# Patient Record
Sex: Female | Born: 1945
Health system: Southern US, Community
[De-identification: ages and names within clinical notes are randomized; demographics above are authoritative.]

---

## 1998-02-13 ENCOUNTER — Ambulatory Visit (HOSPITAL_BASED_OUTPATIENT_CLINIC_OR_DEPARTMENT_OTHER): Admission: RE | Admit: 1998-02-13 | Discharge: 1998-02-13 | Payer: Self-pay | Admitting: Orthopedic Surgery

## 2004-07-29 ENCOUNTER — Ambulatory Visit: Payer: Self-pay | Admitting: Internal Medicine

## 2005-07-28 ENCOUNTER — Ambulatory Visit: Payer: Self-pay | Admitting: Internal Medicine

## 2005-07-30 ENCOUNTER — Ambulatory Visit: Payer: Self-pay | Admitting: Internal Medicine

## 2005-08-14 ENCOUNTER — Ambulatory Visit: Payer: Self-pay | Admitting: Internal Medicine

## 2005-12-31 ENCOUNTER — Inpatient Hospital Stay (HOSPITAL_COMMUNITY): Admission: RE | Admit: 2005-12-31 | Discharge: 2006-01-02 | Payer: Self-pay | Admitting: Neurosurgery

## 2006-08-06 ENCOUNTER — Ambulatory Visit: Payer: Self-pay | Admitting: Internal Medicine

## 2007-08-10 ENCOUNTER — Ambulatory Visit: Payer: Self-pay | Admitting: Internal Medicine

## 2008-04-11 ENCOUNTER — Ambulatory Visit: Payer: Self-pay | Admitting: General Surgery

## 2008-04-11 ENCOUNTER — Other Ambulatory Visit: Payer: Self-pay

## 2008-04-14 ENCOUNTER — Ambulatory Visit: Payer: Self-pay | Admitting: General Surgery

## 2008-08-10 ENCOUNTER — Ambulatory Visit: Payer: Self-pay | Admitting: Internal Medicine

## 2009-08-15 ENCOUNTER — Ambulatory Visit: Payer: Self-pay | Admitting: Internal Medicine

## 2010-05-10 ENCOUNTER — Ambulatory Visit: Payer: Self-pay | Admitting: Internal Medicine

## 2010-06-10 ENCOUNTER — Ambulatory Visit: Payer: Self-pay | Admitting: Sports Medicine

## 2010-06-21 ENCOUNTER — Ambulatory Visit: Payer: Self-pay | Admitting: Unknown Physician Specialty

## 2010-07-09 ENCOUNTER — Ambulatory Visit: Payer: Self-pay | Admitting: Unknown Physician Specialty

## 2010-07-12 LAB — PATHOLOGY REPORT

## 2010-09-02 ENCOUNTER — Ambulatory Visit: Payer: Self-pay | Admitting: Internal Medicine

## 2011-11-26 ENCOUNTER — Ambulatory Visit: Payer: Self-pay | Admitting: Family Medicine

## 2012-01-05 ENCOUNTER — Ambulatory Visit: Payer: Self-pay | Admitting: Gastroenterology

## 2012-02-09 ENCOUNTER — Ambulatory Visit: Payer: Self-pay | Admitting: Unknown Physician Specialty

## 2012-03-18 ENCOUNTER — Ambulatory Visit: Payer: Self-pay | Admitting: Family Medicine

## 2013-06-15 ENCOUNTER — Ambulatory Visit: Payer: Self-pay | Admitting: Family Medicine

## 2014-02-20 ENCOUNTER — Emergency Department: Payer: Self-pay | Admitting: Emergency Medicine

## 2014-11-07 ENCOUNTER — Ambulatory Visit: Payer: Self-pay | Admitting: Surgery

## 2014-11-07 LAB — CBC
HCT: 38.3 % (ref 35.0–47.0)
HGB: 12.2 g/dL (ref 12.0–16.0)
MCH: 25.2 pg — AB (ref 26.0–34.0)
MCHC: 31.7 g/dL — AB (ref 32.0–36.0)
MCV: 79 fL — AB (ref 80–100)
PLATELETS: 207 10*3/uL (ref 150–440)
RBC: 4.82 10*6/uL (ref 3.80–5.20)
RDW: 14.6 % — AB (ref 11.5–14.5)
WBC: 6.2 10*3/uL (ref 3.6–11.0)

## 2014-11-07 LAB — URINALYSIS, COMPLETE
Bacteria: NONE SEEN
Bilirubin,UR: NEGATIVE
Blood: NEGATIVE
Glucose,UR: NEGATIVE mg/dL (ref 0–75)
Ketone: NEGATIVE
LEUKOCYTE ESTERASE: NEGATIVE
Nitrite: NEGATIVE
PH: 6 (ref 4.5–8.0)
PROTEIN: NEGATIVE
Specific Gravity: 1.014 (ref 1.003–1.030)
Squamous Epithelial: 1
WBC UR: NONE SEEN /HPF (ref 0–5)

## 2014-11-07 LAB — BASIC METABOLIC PANEL
ANION GAP: 6 — AB (ref 7–16)
BUN: 18 mg/dL
CO2: 26 mmol/L
CREATININE: 0.55 mg/dL
Calcium, Total: 9.1 mg/dL
Chloride: 106 mmol/L
EGFR (African American): >60
EGFR (Non-African Amer.): >60
GLUCOSE: 99 mg/dL
Potassium: 3.4 mmol/L — ABNORMAL LOW
Sodium: 138 mmol/L

## 2014-11-07 LAB — PROTIME-INR
INR: 1.1
PROTHROMBIN TIME: 14 s

## 2014-11-07 LAB — MRSA PCR SCREENING

## 2014-11-14 ENCOUNTER — Inpatient Hospital Stay
Admit: 2014-11-14 | Disposition: A | Discharge status: Home / Self Care | Payer: Self-pay | Attending: Surgery | Admitting: Surgery

## 2014-11-15 LAB — BASIC METABOLIC PANEL
ANION GAP: 8 (ref 7–16)
BUN: 16 mg/dL
CALCIUM: 8.4 mg/dL — AB
CO2: 27 mmol/L
Chloride: 103 mmol/L
Creatinine: 0.53 mg/dL
EGFR (African American): >60
EGFR (Non-African Amer.): >60
Glucose: 119 mg/dL — ABNORMAL HIGH
POTASSIUM: 3.2 mmol/L — AB
Sodium: 138 mmol/L

## 2014-11-15 LAB — HEMOGLOBIN: HGB: 11.8 g/dL — ABNORMAL LOW (ref 12.0–16.0)

## 2014-11-15 LAB — PLATELET COUNT: PLATELETS: 188 10*3/uL (ref 150–440)

## 2014-11-16 LAB — POTASSIUM: POTASSIUM: 4.2 mmol/L

## 2014-12-04 LAB — SURGICAL PATHOLOGY

## 2014-12-10 NOTE — Op Note (Signed)
PATIENT NAME:  Ann Ryan, Ann Ryan MR#:  [id number] DATE OF BIRTH:  [birth year]  DATE OF PROCEDURE:  11/14/2014  PREOPERATIVE DIAGNOSIS:  Cuff tear arthropathy right shoulder.   POSTOPERATIVE DIAGNOSIS:  Cuff tear arthropathy right shoulder.   PROCEDURE:  Reverse right total shoulder arthroplasty using an all-pressfit Biomet Comprehensive system with a 36 mm glenosphere, a 25 mm mini-base plate, a #04 mini-humeral stem, and a 44 mm humeral tray with a standard humeral insert.   SURGEON:  Maryagnes Amos, MD.   ASSISTANT:  Dedra Skeens, PA-C.   ANESTHESIA:  General endotracheal with an interscalene block placed preoperative by the anesthesiologist.   FINDINGS:  As noted above.   COMPLICATIONS:  None.   ESTIMATED BLOOD LOSS:  125 mL.   TOTAL FLUIDS:  900 mL of crystalloid.   URINE OUTPUT:  550 mL.    TOURNIQUET:  None.   DRAINS:  None.   CLOSURE:  Staples.   BRIEF CLINICAL NOTE:  The patient is a 69 year old female with a long history of progressively worsening right shoulder pain. Her symptoms have progressed despite medications, activity modification, etc. Her history and examination are consistent with massive cuff tear arthropathy as confirmed by plain radiographs and MRI scan. She presents at this time for a reverse right total shoulder arthroplasty.   PROCEDURE: The patient underwent placement of an interscalene block in the preoperative holding area. She was then brought into the operating room and lain in the supine position. After adequate general endotracheal intubation and anesthesia were obtained, a Foley catheter was placed. The patient was then repositioned in the beach chair position using the beach chair positioner. The right shoulder and upper extremity were prepped with ChloraPrep solution before being draped sterilely. Preoperative antibiotics were administered. A standard anterior approach to the shoulder was made through an approximately 4.5-5 inch incision beginning at  the coracoid process and extending inferiorly and laterally toward the insertion of the biceps tendon. The incision was carried down through the subcutaneous tissues to expose the deltopectoral fascia. The interval between the deltoid and pectoralis muscles was identified and this plane developed. The cephalic vein was identified and retracted laterally. This was later noted to have avulsed and was cauterized. Once the deltopectoral interval was developed, the bursal tissues were swept away to better visualize the lateral edge of the conjoined tendon. The self-retaining retractor was inserted beneath the conjoined tendon to better visualize the subscapularis tendon. This was released near its lesser tuberosity attachment, leaving approximately 1 cm of tendon to reattach later. Releases were made superiorly and inferiorly and several tagging sutures placed through the free margin of the subscapularis tendon before it too was retracted medially to provide better exposure to the anterior aspect of the shoulder joint. Of note, the three sisters were identified and coagulated prior to taking down the subscapularis tendon. The humeral head was brought into the wound and, using the external guide, the appropriate proximal humeral cut made.   Attention was directed to the glenoid. Exposure was improved by removing the labrum circumferentially. The center of the glenoid was identified and marked before the guidewire was drilled up through the glenoid into the glenoid neck. Care was taken to be sure that it was within bone both by palpation anteriorly and by using a depth gauge to palpate the bony channel. Once this was verified, the drill guide was advanced further. This was used to guide the glenoid reamer down. Glenoid was reamed appropriately before the permanent mini base plate was  impacted into place. A 35 mm central screw was inserted before the plate was further secured using four peripheral screws. The superior and  inferior screws were locking screws, whereas the anterior and posterior screws were nonlocking screws. Apparent excellent fixation of the glenoid baseplate was achieved. The 36 mm glenosphere was selected. The adapter was inserted and placed in the "D" offset position. After it was constructed on the back table, it was impacted into place. The Morse taper locking mechanism was verified using manual distraction and found to be excellent.   Attention was directed to the humeral side. The arm was externally rotated to position the humeral shaft in the wound optimally. The central canal was accessed through a tapered reamer. The canal was reamed sequentially beginning with the 5 mm reamer and progressing to the 10 mm tapered reamer. This provided excellent cortical chatter. The canal was broached sequentially beginning with a #7 broach, progressing to a #10 broach. This seated well. A trial reduction was performed using the standard humeral tray and insert. The arm demonstrated good tension, yet was able to be manipulated so that the hand could reach the opposite shoulder, the top of the head, and the back of the head. Therefore, the trial components were removed. The humeral shaft was irrigated thoroughly before the permanent #10 mini-humeral stem was impacted into place with care taken to maintain the appropriate retroversion. The 44 mm standard baseplate was and E-polyethylene humeral insert were put together on the back table before this construct was impacted into place. The Morse taper locking mechanism again was verified using manual distraction. The shoulder was relocated again and placed through a range of motion with the findings as described above.   The wound was copiously irrigated with bacitracin saline solution via the jet lavage system. In addition, a total of 20 mL of Exparel diluted out to 60 mL with normal saline was injected into the periarticular tissues and peri-incisional tissues to help with  postoperative analgesia. The subscapularis tendon was reapproximated through the remaining tendinous tissue attached to the lesser tuberosity using #2 FiberWire interrupted sutures. One of the sutures was actually brought through bone tunnels more proximally. The deltopectoral interval was reapproximated using 2-0 Vicryl interrupted sutures before the subcutaneous tissues were closed using 2-0 Vicryl interrupted sutures. The skin was closed using staples. Of note, 1 gram of tranexamic acid in 10 mL of normal saline was injected intra-articularly after closure of the deltopectoral fascia. A sterile occlusive dressing was applied to the shoulder before the arm was placed into a shoulder immobilizer. The patient was then awakened, extubated, and returned to the recovery room in satisfactory condition after tolerating the procedure well.   Of note, Dedra Skeensodd Mundy, my assistant, was present for the entire case. His presence was critical in order to optimize exposure and arm position during the numerous trials, humeral and glenoid preparation, and implant positioning.    ____________________________ J. Derald MacleodJeffrey Poggi, MD jjp:bu D: 11/14/2014 16:49:07 ET T: 11/14/2014 17:15:50 ET JOB#: [id number]  cc: Maryagnes AmosJ. Jeffrey Poggi, MD, <Dictator> Maryagnes AmosJ. JEFFREY POGGI MD ELECTRONICALLY SIGNED 11/16/2014 16:05

## 2015-03-12 ENCOUNTER — Other Ambulatory Visit: Payer: Self-pay | Admitting: Family Medicine

## 2015-03-12 DIAGNOSIS — Z1231 Encounter for screening mammogram for malignant neoplasm of breast: Secondary | ICD-10-CM

## 2015-03-21 ENCOUNTER — Ambulatory Visit
Admission: RE | Admit: 2015-03-21 | Discharge: 2015-03-21 | Disposition: A | Discharge status: Home / Self Care | Payer: Managed Care, Other (non HMO) | Source: Ambulatory Visit | Attending: Family Medicine | Admitting: Family Medicine

## 2015-03-21 DIAGNOSIS — Z1231 Encounter for screening mammogram for malignant neoplasm of breast: Secondary | ICD-10-CM | POA: Diagnosis present

## 2016-09-15 ENCOUNTER — Other Ambulatory Visit: Payer: Self-pay | Admitting: Family Medicine

## 2016-09-15 DIAGNOSIS — Z1231 Encounter for screening mammogram for malignant neoplasm of breast: Secondary | ICD-10-CM

## 2016-10-13 ENCOUNTER — Ambulatory Visit
Admission: RE | Admit: 2016-10-13 | Discharge: 2016-10-13 | Disposition: A | Discharge status: Home / Self Care | Payer: Medicare PPO | Source: Ambulatory Visit | Attending: Family Medicine | Admitting: Family Medicine

## 2016-10-13 DIAGNOSIS — Z1231 Encounter for screening mammogram for malignant neoplasm of breast: Secondary | ICD-10-CM | POA: Diagnosis not present

## 2017-08-31 DIAGNOSIS — M545 Low back pain: Secondary | ICD-10-CM | POA: Diagnosis not present

## 2017-12-08 ENCOUNTER — Other Ambulatory Visit: Payer: Self-pay | Admitting: Family Medicine

## 2017-12-08 DIAGNOSIS — Z1231 Encounter for screening mammogram for malignant neoplasm of breast: Secondary | ICD-10-CM

## 2017-12-25 ENCOUNTER — Ambulatory Visit
Admission: RE | Admit: 2017-12-25 | Discharge: 2017-12-25 | Disposition: A | Discharge status: Home / Self Care | Payer: Medicare HMO | Source: Ambulatory Visit | Attending: Family Medicine | Admitting: Family Medicine

## 2017-12-25 DIAGNOSIS — Z1231 Encounter for screening mammogram for malignant neoplasm of breast: Secondary | ICD-10-CM

## 2018-01-07 DIAGNOSIS — I10 Essential (primary) hypertension: Secondary | ICD-10-CM | POA: Diagnosis not present

## 2018-01-07 DIAGNOSIS — R7303 Prediabetes: Secondary | ICD-10-CM | POA: Diagnosis not present

## 2018-01-07 DIAGNOSIS — E7800 Pure hypercholesterolemia, unspecified: Secondary | ICD-10-CM | POA: Diagnosis not present

## 2018-01-14 DIAGNOSIS — E6609 Other obesity due to excess calories: Secondary | ICD-10-CM | POA: Diagnosis not present

## 2018-01-14 DIAGNOSIS — Z780 Asymptomatic menopausal state: Secondary | ICD-10-CM | POA: Diagnosis not present

## 2018-01-14 DIAGNOSIS — Z0000 Encounter for general adult medical examination without abnormal findings: Secondary | ICD-10-CM | POA: Diagnosis not present

## 2018-01-14 DIAGNOSIS — E7800 Pure hypercholesterolemia, unspecified: Secondary | ICD-10-CM | POA: Diagnosis not present

## 2018-01-14 DIAGNOSIS — R7303 Prediabetes: Secondary | ICD-10-CM | POA: Diagnosis not present

## 2018-01-14 DIAGNOSIS — I10 Essential (primary) hypertension: Secondary | ICD-10-CM | POA: Diagnosis not present

## 2018-01-20 DIAGNOSIS — Z780 Asymptomatic menopausal state: Secondary | ICD-10-CM | POA: Diagnosis not present

## 2018-01-26 DIAGNOSIS — E6609 Other obesity due to excess calories: Secondary | ICD-10-CM | POA: Diagnosis not present

## 2018-01-26 DIAGNOSIS — M8580 Other specified disorders of bone density and structure, unspecified site: Secondary | ICD-10-CM | POA: Diagnosis not present

## 2018-06-18 ENCOUNTER — Other Ambulatory Visit: Payer: Self-pay

## 2018-06-18 ENCOUNTER — Emergency Department: Payer: Medicare Other

## 2018-06-18 ENCOUNTER — Emergency Department
Admission: EM | Admit: 2018-06-18 | Discharge: 2018-06-18 | Disposition: A | Discharge status: Home / Self Care | Payer: Medicare Other | Attending: Emergency Medicine | Admitting: Emergency Medicine

## 2018-06-18 DIAGNOSIS — Y92410 Unspecified street and highway as the place of occurrence of the external cause: Secondary | ICD-10-CM | POA: Insufficient documentation

## 2018-06-18 DIAGNOSIS — S4992XA Unspecified injury of left shoulder and upper arm, initial encounter: Secondary | ICD-10-CM | POA: Diagnosis present

## 2018-06-18 DIAGNOSIS — S8012XA Contusion of left lower leg, initial encounter: Secondary | ICD-10-CM

## 2018-06-18 DIAGNOSIS — S42255A Nondisplaced fracture of greater tuberosity of left humerus, initial encounter for closed fracture: Secondary | ICD-10-CM | POA: Diagnosis not present

## 2018-06-18 DIAGNOSIS — Y9389 Activity, other specified: Secondary | ICD-10-CM | POA: Insufficient documentation

## 2018-06-18 DIAGNOSIS — Y999 Unspecified external cause status: Secondary | ICD-10-CM | POA: Insufficient documentation

## 2018-06-18 NOTE — ED Notes (Signed)
Patient transported to X-ray 
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
## 2018-06-18 NOTE — Discharge Instructions (Signed)
Make an appointment with Dr. Joice Lofts from orthopedics in approximately 1 week.  Use the sling at all times except when showering although you should take it out a few times per day and move the shoulder as we demonstrated.  You can continue to apply ice to the leg and keep it elevated.  Return to the ER for new, worsening, persistent severe pain, numbness, difficulty walking, or any other new or worsening symptoms that concern you.

## 2019-01-28 ENCOUNTER — Other Ambulatory Visit: Payer: Self-pay | Admitting: Family Medicine

## 2019-01-28 DIAGNOSIS — Z1231 Encounter for screening mammogram for malignant neoplasm of breast: Secondary | ICD-10-CM

## 2019-03-09 ENCOUNTER — Other Ambulatory Visit: Payer: Self-pay

## 2019-03-09 ENCOUNTER — Ambulatory Visit
Admission: RE | Admit: 2019-03-09 | Discharge: 2019-03-09 | Disposition: A | Discharge status: Home / Self Care | Payer: Medicare Other | Source: Ambulatory Visit | Attending: Family Medicine | Admitting: Family Medicine

## 2019-03-09 DIAGNOSIS — Z1231 Encounter for screening mammogram for malignant neoplasm of breast: Secondary | ICD-10-CM | POA: Diagnosis present
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
## 2020-02-28 ENCOUNTER — Other Ambulatory Visit: Payer: Self-pay | Admitting: Family Medicine

## 2020-02-28 DIAGNOSIS — Z1231 Encounter for screening mammogram for malignant neoplasm of breast: Secondary | ICD-10-CM

## 2020-03-14 ENCOUNTER — Ambulatory Visit
Admission: RE | Admit: 2020-03-14 | Discharge: 2020-03-14 | Disposition: A | Discharge status: Home / Self Care | Payer: Medicare Other | Source: Ambulatory Visit | Attending: Family Medicine | Admitting: Family Medicine

## 2020-03-14 ENCOUNTER — Other Ambulatory Visit: Payer: Self-pay

## 2020-03-14 DIAGNOSIS — Z1231 Encounter for screening mammogram for malignant neoplasm of breast: Secondary | ICD-10-CM | POA: Diagnosis not present

## 2020-03-20 ENCOUNTER — Other Ambulatory Visit: Payer: Self-pay | Admitting: Family Medicine

## 2020-03-20 DIAGNOSIS — R928 Other abnormal and inconclusive findings on diagnostic imaging of breast: Secondary | ICD-10-CM

## 2020-03-20 DIAGNOSIS — R921 Mammographic calcification found on diagnostic imaging of breast: Secondary | ICD-10-CM

## 2020-03-29 ENCOUNTER — Ambulatory Visit: Payer: Medicare Other

## 2020-04-10 ENCOUNTER — Other Ambulatory Visit: Payer: Self-pay

## 2020-04-10 ENCOUNTER — Ambulatory Visit
Admission: RE | Admit: 2020-04-10 | Discharge: 2020-04-10 | Disposition: A | Discharge status: Home / Self Care | Payer: Medicare Other | Source: Ambulatory Visit | Attending: Family Medicine | Admitting: Family Medicine

## 2020-04-10 DIAGNOSIS — R928 Other abnormal and inconclusive findings on diagnostic imaging of breast: Secondary | ICD-10-CM | POA: Diagnosis present

## 2020-04-10 DIAGNOSIS — R921 Mammographic calcification found on diagnostic imaging of breast: Secondary | ICD-10-CM | POA: Diagnosis present

## 2020-08-01 DIAGNOSIS — R7303 Prediabetes: Secondary | ICD-10-CM | POA: Diagnosis not present

## 2020-08-01 DIAGNOSIS — Z1159 Encounter for screening for other viral diseases: Secondary | ICD-10-CM | POA: Diagnosis not present

## 2020-08-01 DIAGNOSIS — E7800 Pure hypercholesterolemia, unspecified: Secondary | ICD-10-CM | POA: Diagnosis not present

## 2020-08-01 DIAGNOSIS — I10 Essential (primary) hypertension: Secondary | ICD-10-CM | POA: Diagnosis not present

## 2020-08-08 DIAGNOSIS — E7800 Pure hypercholesterolemia, unspecified: Secondary | ICD-10-CM | POA: Diagnosis not present

## 2020-08-08 DIAGNOSIS — R7303 Prediabetes: Secondary | ICD-10-CM | POA: Diagnosis not present

## 2020-08-08 DIAGNOSIS — I10 Essential (primary) hypertension: Secondary | ICD-10-CM | POA: Diagnosis not present

## 2021-03-05 DIAGNOSIS — E7800 Pure hypercholesterolemia, unspecified: Secondary | ICD-10-CM | POA: Diagnosis not present

## 2021-03-05 DIAGNOSIS — I10 Essential (primary) hypertension: Secondary | ICD-10-CM | POA: Diagnosis not present

## 2021-03-05 DIAGNOSIS — R7303 Prediabetes: Secondary | ICD-10-CM | POA: Diagnosis not present

## 2021-03-12 DIAGNOSIS — E785 Hyperlipidemia, unspecified: Secondary | ICD-10-CM | POA: Diagnosis not present

## 2021-03-12 DIAGNOSIS — R7303 Prediabetes: Secondary | ICD-10-CM | POA: Diagnosis not present

## 2021-03-12 DIAGNOSIS — I10 Essential (primary) hypertension: Secondary | ICD-10-CM | POA: Diagnosis not present

## 2021-03-12 DIAGNOSIS — Z0000 Encounter for general adult medical examination without abnormal findings: Secondary | ICD-10-CM | POA: Diagnosis not present

## 2021-03-12 DIAGNOSIS — Z1389 Encounter for screening for other disorder: Secondary | ICD-10-CM | POA: Diagnosis not present

## 2021-06-11 ENCOUNTER — Other Ambulatory Visit: Payer: Self-pay | Admitting: Family Medicine

## 2021-06-11 DIAGNOSIS — Z1231 Encounter for screening mammogram for malignant neoplasm of breast: Secondary | ICD-10-CM

## 2021-06-12 ENCOUNTER — Other Ambulatory Visit: Payer: Self-pay

## 2021-06-12 ENCOUNTER — Ambulatory Visit
Admission: RE | Admit: 2021-06-12 | Discharge: 2021-06-12 | Disposition: A | Discharge status: Home / Self Care | Payer: Medicare HMO | Source: Ambulatory Visit | Attending: Family Medicine | Admitting: Family Medicine

## 2021-06-12 DIAGNOSIS — Z1231 Encounter for screening mammogram for malignant neoplasm of breast: Secondary | ICD-10-CM | POA: Insufficient documentation

## 2022-10-31 IMAGING — MG MM DIGITAL SCREENING BILAT W/ TOMO AND CAD
6 of 10 series · 6 of 30 positions shown · non-contrast
Comparison: Previous exam(s).

CLINICAL DATA: Screening.

EXAM:
DIGITAL SCREENING BILATERAL MAMMOGRAM WITH TOMOSYNTHESIS AND CAD
TECHNIQUE: Bilateral screening digital craniocaudal and mediolateral oblique
mammograms were obtained. Bilateral screening digital breast
tomosynthesis was performed. The images were evaluated with
computer-aided detection.

[R MLO synth-2D (1 of 2)]
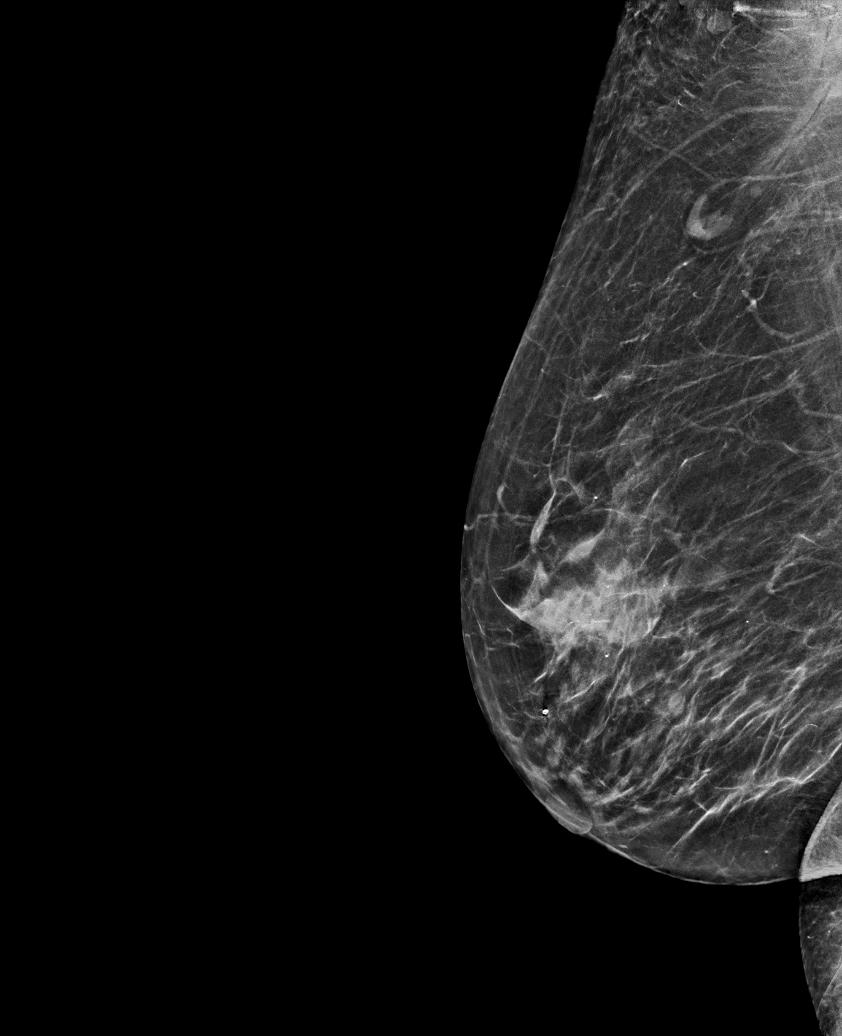

[L MLO synth-2D]
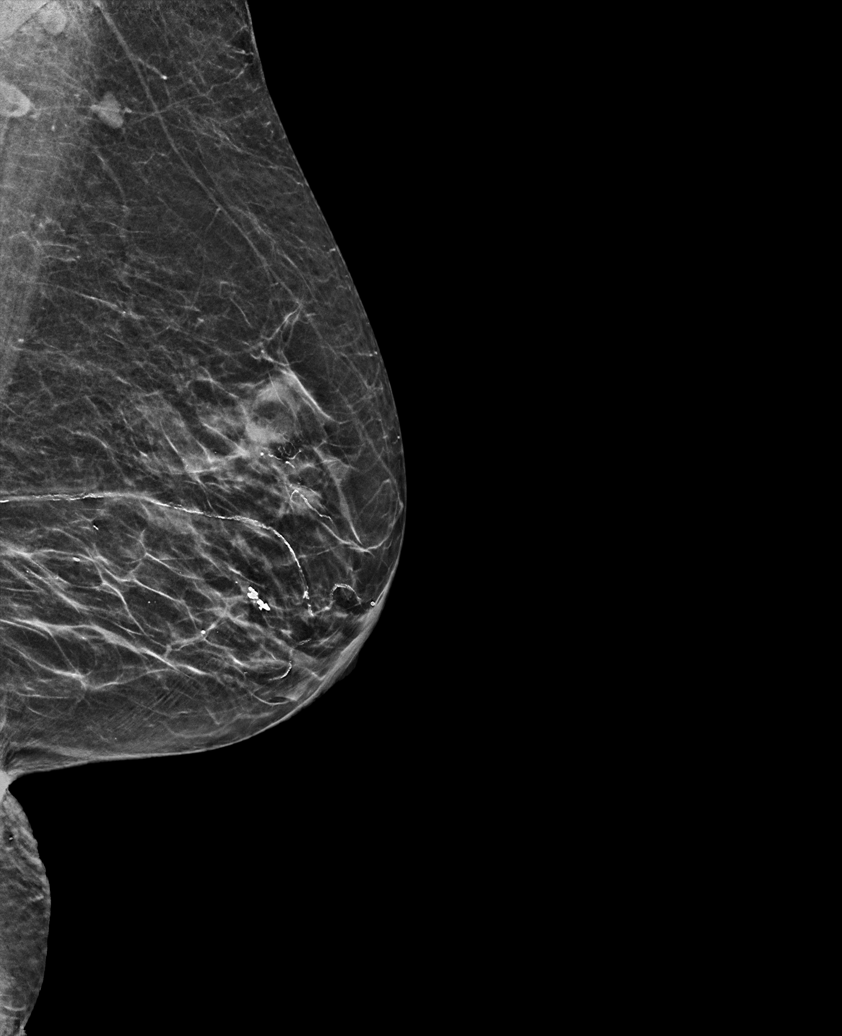

[R CC synth-2D]
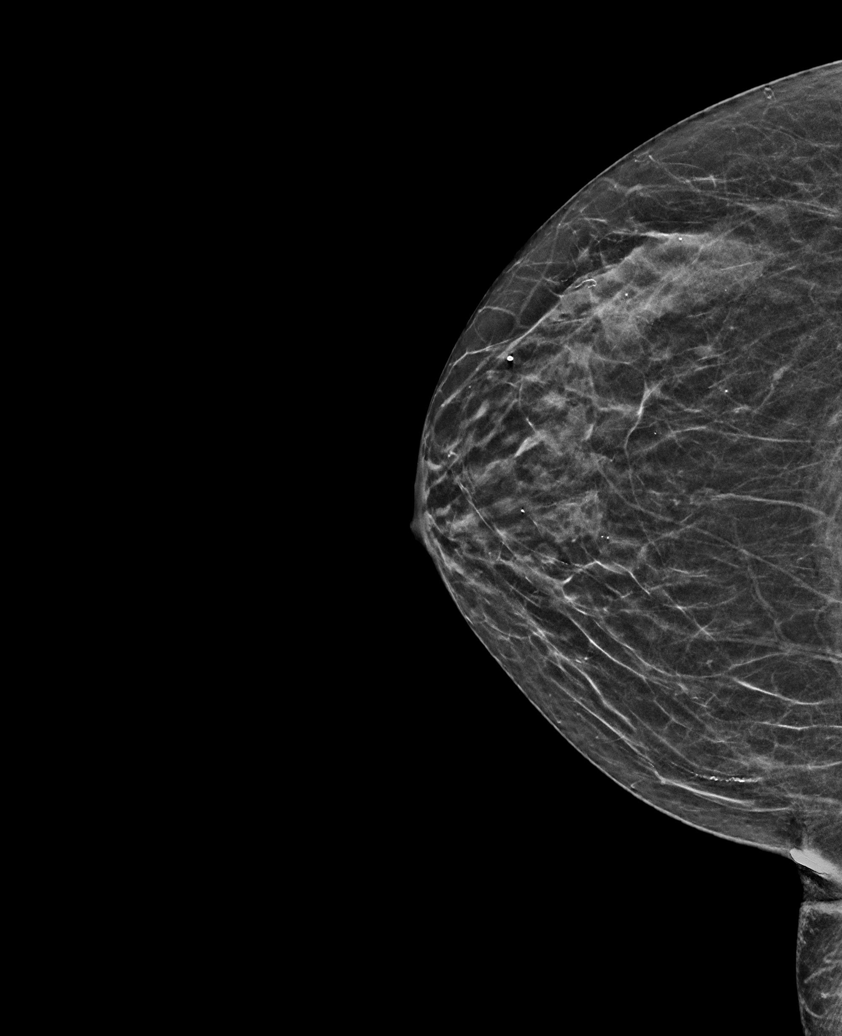

[L CC synth-2D]
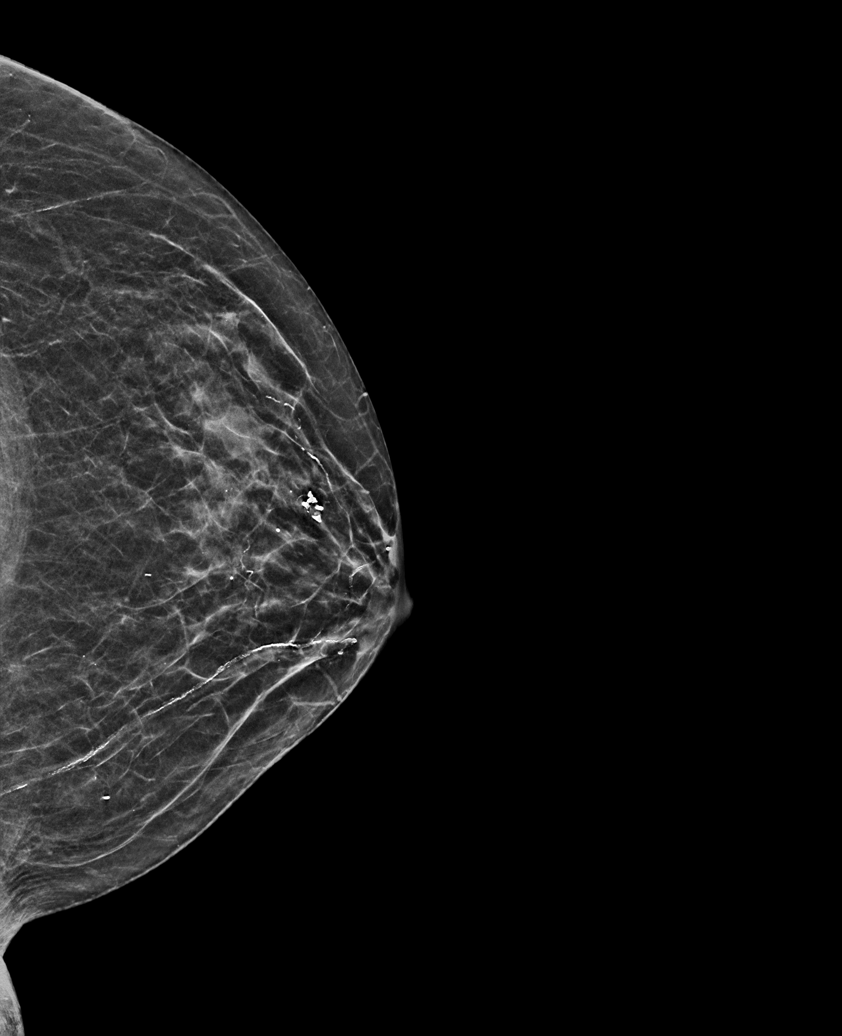

[R MLO synth-2D (2 of 2)]
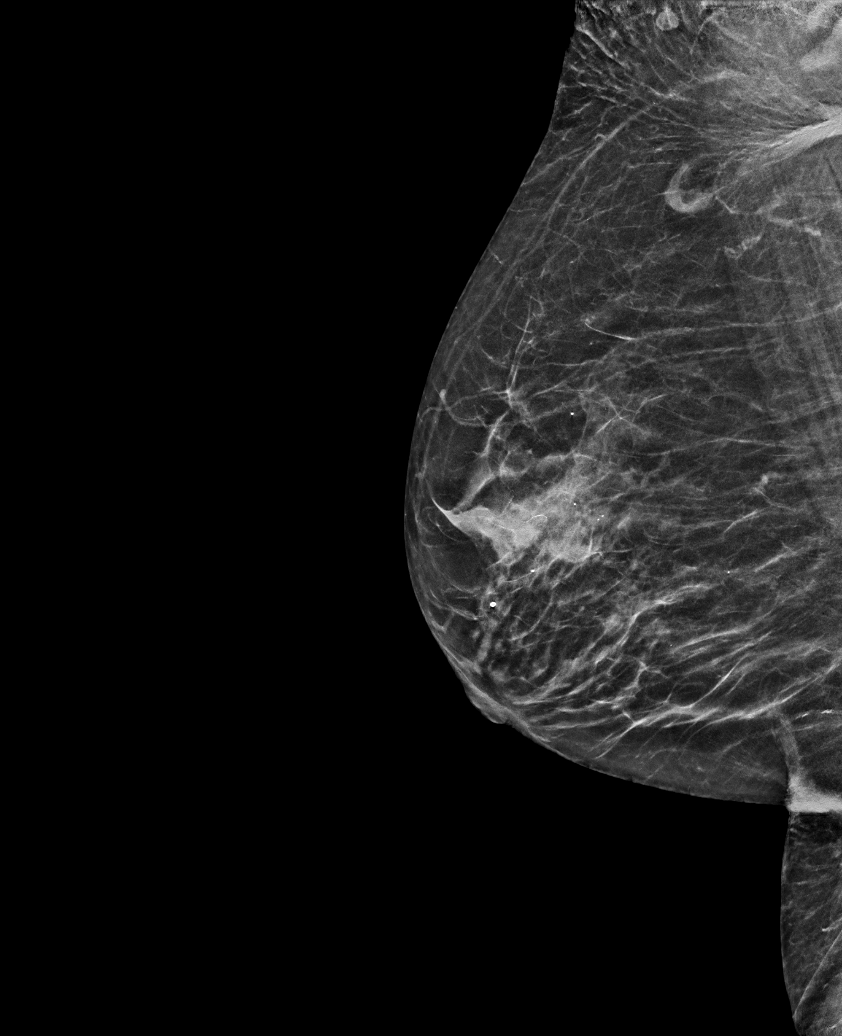

[R CC tomo · tomo slice 21/42.0]
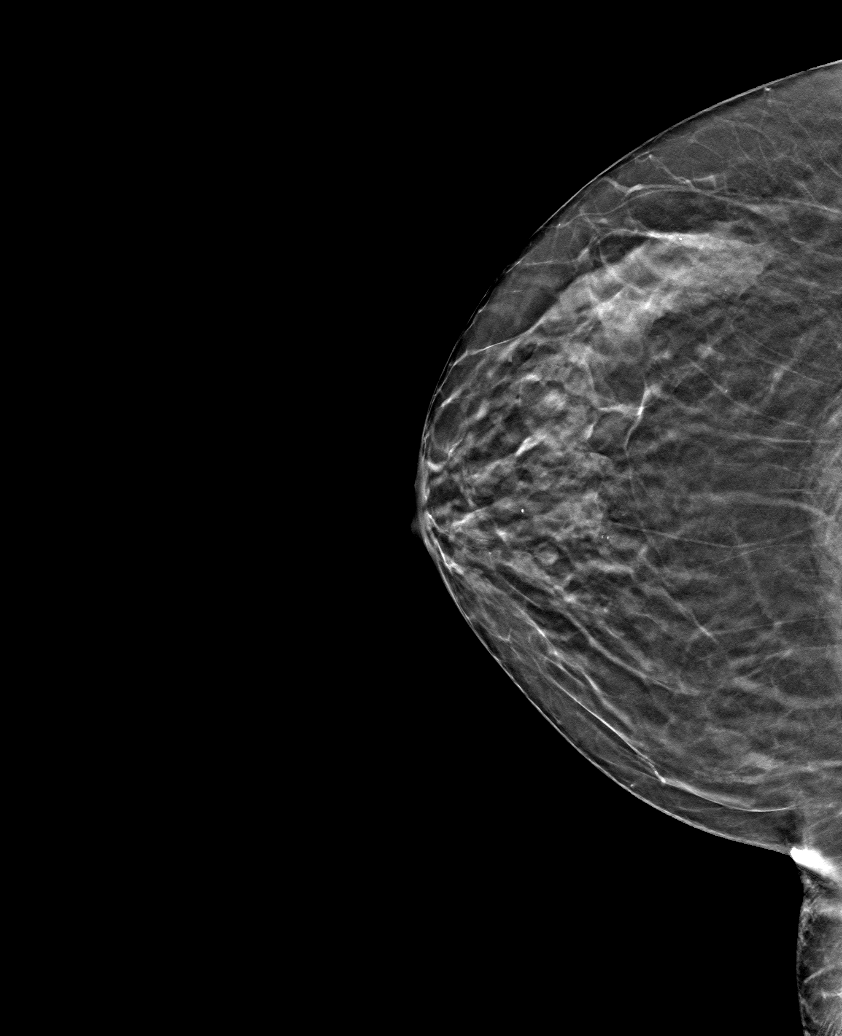

[6 of 30 positions shown; findings below may reference images not displayed]

ACR Breast Density Category b: There are scattered areas of
fibroglandular density.
FINDINGS: There are no findings suspicious for malignancy.
IMPRESSION: No mammographic evidence of malignancy. A result letter of this
screening mammogram will be mailed directly to the patient.

RECOMMENDATION:
Screening mammogram in one year. (Code:51-O-LD2)

BI-RADS CATEGORY  1: Negative.

## 2023-07-24 ENCOUNTER — Other Ambulatory Visit: Payer: Self-pay | Admitting: Family Medicine

## 2023-07-24 DIAGNOSIS — Z1231 Encounter for screening mammogram for malignant neoplasm of breast: Secondary | ICD-10-CM

## 2023-08-17 ENCOUNTER — Ambulatory Visit
Admission: RE | Admit: 2023-08-17 | Discharge: 2023-08-17 | Disposition: A | Discharge status: Home / Self Care | Payer: Medicare HMO | Source: Ambulatory Visit | Attending: Family Medicine | Admitting: Family Medicine

## 2023-08-17 DIAGNOSIS — Z1231 Encounter for screening mammogram for malignant neoplasm of breast: Secondary | ICD-10-CM | POA: Insufficient documentation

## 2024-08-08 ENCOUNTER — Other Ambulatory Visit: Payer: Self-pay | Admitting: Family Medicine

## 2024-08-08 DIAGNOSIS — Z1231 Encounter for screening mammogram for malignant neoplasm of breast: Secondary | ICD-10-CM

## 2024-08-19 ENCOUNTER — Ambulatory Visit
Admission: RE | Admit: 2024-08-19 | Discharge: 2024-08-19 | Disposition: A | Source: Ambulatory Visit | Attending: Family Medicine | Admitting: Family Medicine

## 2024-08-19 DIAGNOSIS — Z1231 Encounter for screening mammogram for malignant neoplasm of breast: Secondary | ICD-10-CM | POA: Diagnosis present
